# Patient Record
Sex: Female | Born: 1970 | Race: White | Hispanic: No | Marital: Married | Smoking: Former smoker
Health system: Southern US, Community
[De-identification: ages and names within clinical notes are randomized; demographics above are authoritative.]

## PROBLEM LIST (undated history)

## (undated) DIAGNOSIS — T7840XA Allergy, unspecified, initial encounter: Secondary | ICD-10-CM

## (undated) HISTORY — DX: Allergy, unspecified, initial encounter: T78.40XA

---

## 1997-12-29 ENCOUNTER — Emergency Department (HOSPITAL_COMMUNITY): Admission: EM | Admit: 1997-12-29 | Discharge: 1997-12-29 | Payer: Self-pay | Admitting: Emergency Medicine

## 1998-05-14 ENCOUNTER — Other Ambulatory Visit: Admission: RE | Admit: 1998-05-14 | Discharge: 1998-05-14 | Payer: Self-pay | Admitting: Obstetrics and Gynecology

## 1998-08-04 ENCOUNTER — Other Ambulatory Visit: Admission: RE | Admit: 1998-08-04 | Discharge: 1998-08-04 | Payer: Self-pay | Admitting: Obstetrics and Gynecology

## 1998-12-14 ENCOUNTER — Other Ambulatory Visit: Admission: RE | Admit: 1998-12-14 | Discharge: 1998-12-14 | Payer: Self-pay | Admitting: Obstetrics and Gynecology

## 1999-05-31 ENCOUNTER — Other Ambulatory Visit: Admission: RE | Admit: 1999-05-31 | Discharge: 1999-05-31 | Payer: Self-pay | Admitting: Obstetrics and Gynecology

## 2000-07-04 ENCOUNTER — Other Ambulatory Visit: Admission: RE | Admit: 2000-07-04 | Discharge: 2000-07-04 | Payer: Self-pay | Admitting: Obstetrics and Gynecology

## 2000-09-05 ENCOUNTER — Other Ambulatory Visit: Admission: RE | Admit: 2000-09-05 | Discharge: 2000-09-05 | Payer: Self-pay | Admitting: Obstetrics and Gynecology

## 2000-09-05 ENCOUNTER — Encounter (INDEPENDENT_AMBULATORY_CARE_PROVIDER_SITE_OTHER): Payer: Self-pay | Admitting: Specialist

## 2001-01-15 ENCOUNTER — Other Ambulatory Visit: Admission: RE | Admit: 2001-01-15 | Discharge: 2001-01-15 | Payer: Self-pay | Admitting: Obstetrics and Gynecology

## 2001-08-06 ENCOUNTER — Other Ambulatory Visit: Admission: RE | Admit: 2001-08-06 | Discharge: 2001-08-06 | Payer: Self-pay | Admitting: Obstetrics and Gynecology

## 2002-03-26 ENCOUNTER — Other Ambulatory Visit: Admission: RE | Admit: 2002-03-26 | Discharge: 2002-03-26 | Payer: Self-pay | Admitting: Obstetrics and Gynecology

## 2002-07-10 ENCOUNTER — Inpatient Hospital Stay (HOSPITAL_COMMUNITY): Admission: AD | Admit: 2002-07-10 | Discharge: 2002-07-14 | Payer: Self-pay | Admitting: Obstetrics and Gynecology

## 2002-07-12 ENCOUNTER — Encounter: Payer: Self-pay | Admitting: Obstetrics & Gynecology

## 2002-07-14 ENCOUNTER — Encounter: Payer: Self-pay | Admitting: Obstetrics & Gynecology

## 2002-08-10 ENCOUNTER — Inpatient Hospital Stay (HOSPITAL_COMMUNITY): Admission: AD | Admit: 2002-08-10 | Discharge: 2002-08-10 | Payer: Self-pay | Admitting: Obstetrics & Gynecology

## 2002-10-15 ENCOUNTER — Inpatient Hospital Stay (HOSPITAL_COMMUNITY): Admission: AD | Admit: 2002-10-15 | Discharge: 2002-10-19 | Payer: Self-pay | Admitting: Obstetrics and Gynecology

## 2002-10-16 ENCOUNTER — Encounter (INDEPENDENT_AMBULATORY_CARE_PROVIDER_SITE_OTHER): Payer: Self-pay

## 2002-10-20 ENCOUNTER — Encounter: Admission: RE | Admit: 2002-10-20 | Discharge: 2002-11-19 | Payer: Self-pay | Admitting: Obstetrics and Gynecology

## 2002-11-15 ENCOUNTER — Other Ambulatory Visit: Admission: RE | Admit: 2002-11-15 | Discharge: 2002-11-15 | Payer: Self-pay | Admitting: Obstetrics and Gynecology

## 2003-11-17 ENCOUNTER — Other Ambulatory Visit: Admission: RE | Admit: 2003-11-17 | Discharge: 2003-11-17 | Payer: Self-pay | Admitting: *Deleted

## 2003-12-05 ENCOUNTER — Other Ambulatory Visit: Admission: RE | Admit: 2003-12-05 | Discharge: 2003-12-05 | Payer: Self-pay | Admitting: *Deleted

## 2004-01-01 ENCOUNTER — Ambulatory Visit (HOSPITAL_COMMUNITY): Admission: RE | Admit: 2004-01-01 | Discharge: 2004-01-01 | Payer: Self-pay | Admitting: *Deleted

## 2004-10-03 ENCOUNTER — Emergency Department (HOSPITAL_COMMUNITY): Admission: EM | Admit: 2004-10-03 | Discharge: 2004-10-03 | Payer: Self-pay | Admitting: Emergency Medicine

## 2006-02-06 ENCOUNTER — Ambulatory Visit (HOSPITAL_COMMUNITY): Admission: RE | Admit: 2006-02-06 | Discharge: 2006-02-06 | Payer: Self-pay | Admitting: *Deleted

## 2006-04-14 ENCOUNTER — Ambulatory Visit (HOSPITAL_COMMUNITY): Admission: RE | Admit: 2006-04-14 | Discharge: 2006-04-14 | Payer: Self-pay | Admitting: *Deleted

## 2007-02-20 ENCOUNTER — Inpatient Hospital Stay (HOSPITAL_COMMUNITY): Admission: RE | Admit: 2007-02-20 | Discharge: 2007-02-23 | Payer: Self-pay | Admitting: *Deleted

## 2007-02-20 ENCOUNTER — Encounter (INDEPENDENT_AMBULATORY_CARE_PROVIDER_SITE_OTHER): Payer: Self-pay | Admitting: *Deleted

## 2007-10-29 IMAGING — RF DG HYSTEROGRAM
5 series · 5 of 5 positions shown · IV contrast (omnipaque)
Comparison: none

CLINICAL DATA: Secondary infertility. Assess tubal patency.
 HYSTEROSALPINGOGRAM ? 02/06/06:
TECHNIQUE: Following sterile cleansing of the external cervical os with Betadine, hysterosalpingography was performed via placement of a 5 French hysterosalpingography catheter into the endocervical canal where it was stabilized with an end-catheter balloon.  Approximately 12 cc of Omnipaque 300% was injected into the endometrial canal without difficulty.

[Series 1: run · 1 of 1 slices shown (1 of 5)]
[im 1/1]
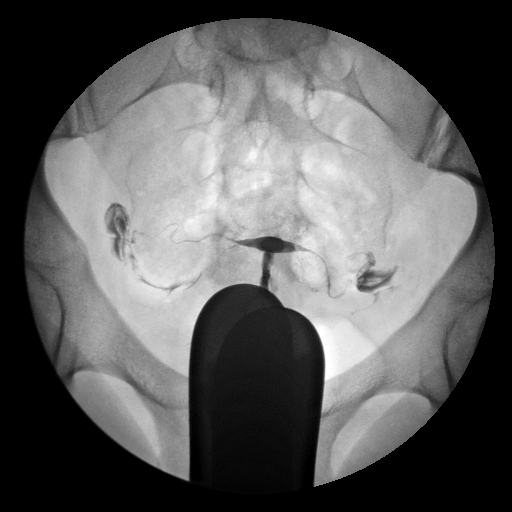

[Series 2: run · 1 of 1 slices shown (2 of 5)]
[im 1/1]
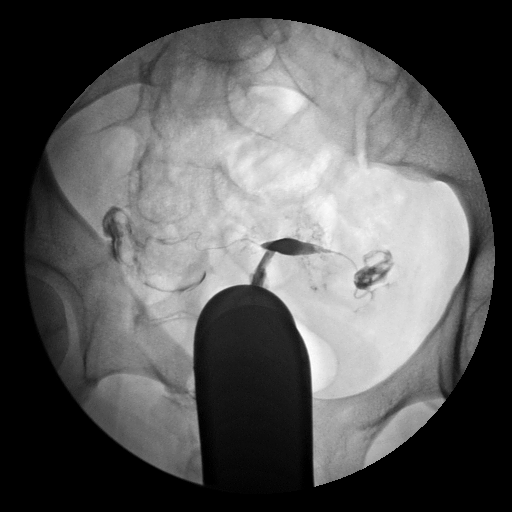

[Series 3: run · 1 of 1 slices shown (3 of 5)]
[im 1/1]
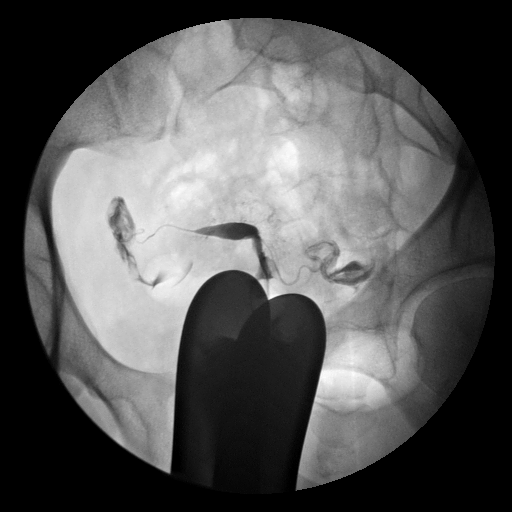

[Series 4: run · 1 of 1 slices shown (4 of 5)]
[im 1/1]
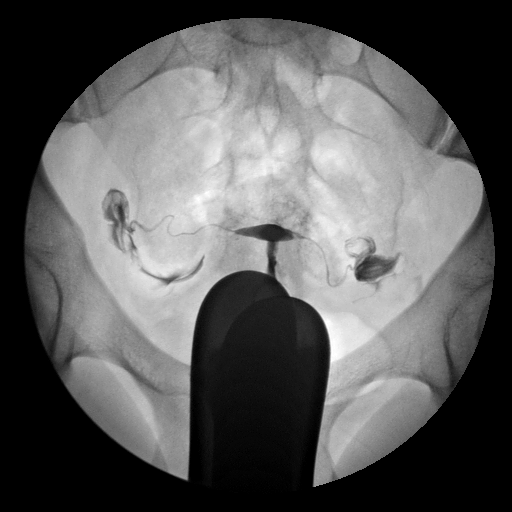

[Series 5: run · 1 of 1 slices shown (5 of 5)]
[im 1/1]
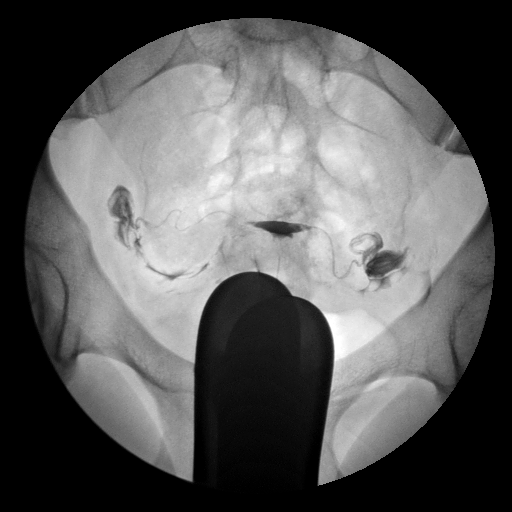

[5 of 5 positions shown; findings below may reference images not displayed]

FINDINGS: Normal endometrial morphology is noted.  Both fallopian tubes demonstrate morphology and bilateral free intraperitoneal spill was documented.  Post-drainage films demonstrate no evidence for loculation of contrast to suggest the presence of peritubal or periovarian adhesions.
IMPRESSION: Normal HSG.

## 2010-08-10 ENCOUNTER — Other Ambulatory Visit: Payer: Self-pay | Admitting: Obstetrics and Gynecology

## 2010-12-07 NOTE — Op Note (Signed)
NAME:  Andrea Case, Andrea Case               ACCOUNT NO.:  [id number]   MEDICAL RECORD NO.:  [id number]          PATIENT TYPE:  INP   LOCATION:  9127                          [facility]AC[facility]  [facility]   PHYSICIAN:  [place] B. Earlene Plater, M.D.  DATE OF BIRTH:  06/28/1971   DATE OF PROCEDURE:  02/20/2007  DATE OF DISCHARGE:                               OPERATIVE REPORT   PREOPERATIVE DIAGNOSES:  1. 38-week intrauterine pregnancy.  2. Previous cesarean section.  3. Oligohydramnios.   POSTOPERATIVE DIAGNOSES:  1. 38-week intrauterine pregnancy.  2. Previous cesarean section.  3. Oligohydramnios.   PROCEDURE:  Repeat low transverse C-section.   SURGEON:  Chester Holstein. Earlene Plater, M.D.   ASSISTANT:  Marlinda Mike, C.N.M.   ANESTHESIA:  Spinal.   SPECIMENS:  Placenta to pathology.   BLOOD LOSS:  500 mL.   COMPLICATIONS:  None.   FINDINGS:  Viable female, vertex presentation; 9 and 9 Apgars.  6 pounds  0 ounces.  Bilobed placenta with velamentous cord insertion.   INDICATION:  The patient was admitted for a repeat C-section for  oligohydramnios at 38 weeks.  She had been followed in the office for  known placenta with velamentous cord insertion.  She had normal serial  ultrasounds showing appropriate girth and normal fluid until last week,  when her fluid was noted to be getting lower.  At that point the AFI was  8.  The patient was instructed to rest at home, hydrate aggressively,  and return in one week for repeat ultrasound.  Yesterday in the office  her AFI was 5; biophysical was 8 out of 8.  Given the trend of worsening  oligohydramnios with known placental complications, I recommended we  proceed with repeat cesarean section.  The patient advised of the risks  of surgery, including infection, bleeding and damage to surrounding  organs.   PROCEDURE:  The patient was taken to the operating room and spinal  anesthesia obtained.  She was prepped in standard fashion.  A Foley  catheter was inserted into the  bladder.   A Pfannenstiel incision made through the previous scar and carried  sharply to the fascia.  The fascia was divided sharply and elevated with  Kocher clamps.  The underlying rectus muscles were dissected away  sharply.   Posterior sheath and peritoneum were elevated and entered sharply.  Bladder blade was inserted.  Bladder flap created sharply.  Uterine  incision made in a low transverse fashion with a knife. Scanty clear  fluid present at amniotomy.  Vertex was elevated to the incision; with  fundal pressure it could not be delivered.  Therefore, the Mityvac  mushroom cup vacuum was placed on the flexion point.  From the mid green  zone, one pull was necessary; no pop offs encountered.  Vertex easily  delivered.  Nose and mouth were suctioned with the bulb.  The remainder  of the infant was delivered without difficulty.  The Cord was clamped  and cut; the infant handed off to the  awaiting pediatricians.  Then 1  gram Ancef was given at cord clamp.   The placenta  was removed by uterine massage.  It was inspected and was  essentially a bilobed placenta with membranes intervening between the  lobes for at least 2-3 cm.  In addition, the cord was velamentous.   The uterus was exteriorized and cleared of all clots and debris.  The  incision was free of extension.  It was closed in a running locked  stitch of 0 chromic.  A second imbricating layer was placed.  A bleeder  at the right angle and in the midline of the incision were each made  hemostatic with a figure-of-eight stitch of the same suture.  Tubes and  ovaries appeared normal.  The uterus was returned to the pelvis.  The  abdomen was irrigated and the uterine incision reinspected; it was  hemostatic, as was the bladder flap and subfascial space.   The fascia was closed in a running stitch of 0 Vicryl.  The subcutaneous  tissue was irrigated and made hemostatic with the Bovie.  The skin was  closed with staples.    The patient tolerated the procedure well and there were no  complications.  She was taken to the recovery room, awake, alert and in  stable condition.  All counts were correct per the operating staff.      Gerri Spore B. Earlene Plater, M.D.  Electronically Signed     WBD/MEDQ  D:  02/20/2007  T:  02/20/2007  Job:  [id number]

## 2010-12-07 NOTE — Discharge Summary (Signed)
NAME:  Andrea Case, Andrea Case               ACCOUNT NO.:  [id number]   MEDICAL RECORD NO.:  [id number]          PATIENT TYPE:  INP   LOCATION:  9127                          [facility]   PHYSICIAN:  [place] B. Earlene Plater, M.D.  DATE OF BIRTH:  Jul 03, 1971   DATE OF ADMISSION:  02/20/2007  DATE OF DISCHARGE:  02/23/2007                               DISCHARGE SUMMARY   ADMISSION DIAGNOSES:  1. A 40-week intrauterine pregnancy.  2. Previous cesarean section.  3. Oligohydramnios.   DISCHARGE DIAGNOSES:  1. A 40-week intrauterine pregnancy.  2. Previous cesarean section.  3. Oligohydramnios.   PROCEDURE:  Repeat low-transverse C-section.   HISTORY OF PRESENT ILLNESS:  A 40 year old white female, gravida 3, para  1, A1, followed in the office for a history of bilobed placenta with  velamentous cord insertion, had appropriate growth and fluid until the  week prior to delivery when her AFI dropped to 8.  Repeat 1 week later  despite rest and hydration the fluid had dropped to an AFI of 5.  Therefore, she was recommended we proceed with delivery.   HOSPITAL COURSE:  The patient was admitted and underwent repeat low-  transverse C-section.  Findings at the time of surgery include 6 pounds  zero ounce female, Apgar's 9 and 9.  Placenta was bilobed with a  velamentous cord insertion.  Postoperatively, the patient rapidly regained her ability to ambulate,  void, and tolerate a regular diet.  She was discharged home on the 3rd  postoperative day in satisfactory condition.   DISCHARGE INSTRUCTIONS:  Per booklet.   DISCHARGE MEDICATIONS:  Percocet 1-2 p.o. q.4-6 h. p.r.n. pain.   [facility] weeks.   DISPOSITION AT DISCHARGE:  Satisfactory.      Gerri Spore B. Earlene Plater, M.D.  Electronically Signed     WBD/MEDQ  D:  02/23/2007  T:  02/23/2007  Job:  [id number]

## 2010-12-10 NOTE — Discharge Summary (Signed)
NAME:  Andrea Case, Andrea Case                         ACCOUNT NO.:  [id number]   MEDICAL RECORD NO.:  [id number]                   PATIENT TYPE:  INP   LOCATION:  9145                                 [facility]   PHYSICIAN:  Maxie Better, M.D.            DATE OF BIRTH:  04/13/1971   DATE OF ADMISSION:  10/15/2002  DATE OF DISCHARGE:  10/19/2002                                 DISCHARGE SUMMARY   ADMISSION DIAGNOSES:  1. Intrauterine gestation at 38 weeks.  2. Pregnancy-induced hypertension.   DISCHARGE DIAGNOSES:  1. Intrauterine gestation at 38+ weeks, delivered.  2. Arrest of descent.  3. Chorioamnionitis.  4. Pregnancy-induced hypertension.  5. Status post primary cesarean section.   HISTORY OF PRESENT ILLNESS:  This is a 40 year old gravida 1, para 0 female  at 38+ weeks gestation, admitted for induction of labor secondary to  pregnancy-induced hypertension.  The patient's prenatal course has been  notable for transient intrauterine growth restriction, oligohydramnios,  which resolved after IV hydration, and a placental mass, which was followed  closely during the pregnancy.   HOSPITAL COURSE:  The patient was admitted for induction secondary to the  above findings.  She had Cervidil for cervical ripening.  Pitocin was  subsequently started on the following day.  Artificial rupture of membranes  was performed.  The patient progressed to full dilatation, pushed for three  hours without any further descent.  The patient developed a temperature of  100.2 around the same time.  The patient was taken to the operating room  where she underwent a primary cesarean section with a resultant delivery of  a live female, 5 pounds 15 ounces, Apgars of 9 and 10.  Placenta was sent to  pathology; subsequently, she had mature, trilobed placenta with slight acute  chorioamnionitis.  Normal tubes and ovaries were noted at the time of the  surgery.  The patient did not necessitate any  magnesium sulfate for her high  blood pressure.  Cord pH was 7.34.   The patient had an unremarkable postoperative course.  CBC on postop day  number one showed a hemoglobin of 10.7, hematocrit of 31.7, and platelet  count of 127,000.  By postop day number three, the patient was afebrile,  tolerating a regular diet.  Incision showed no evidence of infection.  She  was deemed ready to be discharged home.   DISPOSITION:  Home.   CONDITION:  Stable.   DISCHARGE MEDICATIONS:  1. Tylox one to two tablets every three to four hours p.r.n. pain.  2. Motrin 800 mg one p.o. q.6-8h. p.r.n. pain.  3. Prenatal vitamins one p.o. daily.   FOLLOW UP:  Follow up at [facility] in four weeks.    DISCHARGE INSTRUCTIONS:  1. Call for temperature greater than or equal to 100.4.  2. Nothing per vagina for four to six weeks.  3. No heavy lifting and no driving for two weeks.  4. Call for severe abdominal pain, nausea or vomiting, soaking a regular pad     more frequently.                                               Maxie Better, M.D.    [place]/MEDQ  D:  11/26/2002  T:  11/27/2002  Job:  [id number]

## 2010-12-10 NOTE — H&P (Signed)
NAME:  Andrea Case, Andrea Case                           ACCOUNT NO.:  [id number]   MEDICAL RECORD NO.:  [id number]                   PATIENT TYPE:  INP   LOCATION:  9125                                 [facility]   PHYSICIAN:  Genia Del, M.D.             DATE OF BIRTH:  August 01, 1970   DATE OF ADMISSION:  07/10/2002  DATE OF DISCHARGE:                                HISTORY & PHYSICAL   CHIEF COMPLAINT:  Oligohydramnios and severe intrauterine growth  restriction.   HISTORY OF PRESENT ILLNESS:  This is a 40 year old gravida 1, para 0 married  white female with last menstrual period of January 18, 2002 and [facility] of October 27, 2002 who is now at 24-5/[redacted] weeks gestation admitted for aggressive IV  hydration secondary to oligohydramnios.  The patient underwent an ultrasound  on July 10, 2002 to follow up a placental mass that had initially been  seen since her ninth week of pregnancy.  Included in the ultrasound  evaluation was an estimated fetal weight of 619 grams, which was at the  eighth percentile, an amniotic fluid index of 10.0, which was consistent  with oligohydramnios at the sixth percentile, in addition the placental mass  was not identified.  There was a hyperechoic area at the level of the  kidney, which is still present and previously seen on prior ultrasound.  The  hyperechoic area in the bowel region resulting in the patient undergoing an  amniocentesis as well as antiphospholipid antibody panel, which was  negative. The amniocentesis was normal, suggesting a female infant. Cystic  fibrosis evaluation was negative.  The patient does not have a family  history of cystic fibrosis. The patient underwent an ultrasound at [facility] to have a second opinion regarding this placental mass.  They felt this mass was consistent with possible marginal bleeding or  placental infarct.  The ultrasound was done on May 21, 2002 at which  time the estimated fetal  weight was 200 gram, which was at the 51st  percentile.   The patient has no history of hypertension.  She is a nonsmoker.  She has  had a 14-pound weight gain to date.  The patient is now being admitted for  IV fluids and close fetal surveillance.  Her Doppler study was high normal.   PRENATAL FOLLOW UP AND LABORATORY DATA:  Prenatal care was at [facility], obstetrician [facility].  Blood type was A positive, antibody  screen was negative.  RPR was nonreactive.  Rubella was immune.  Hepatitis B  surface antigen was negative.  HIV test was negative.  GC and Chlamydia  cultures were negative.  Pap was within normal limits.  Maternal AFP at  three had been declined. Ultrasound on September 02 for size greater than  dates had shown an intrauterine pregnancy at 9.5 weeks with a hyperechoic  mass measuring 3.0 x 2.2  cm adjacent to the placenta.  Repeat study on  October 01st at 13.4 weeks still showed the mass at about the same size.  This resulted in ultrasound done at [facility] as stated in  the history of present illness.  Anatomic fetal survey was done on November  19th, 19.3 weeks; hyperechoic bowels at the level of the renals.  The  placental mass was 2.8 x 2.1 x 2.7 cm, as stated, amniocentesis was then  done for genetic and cystic fibrosis.   ALLERGIES:  No known drug allergies.   MEDICATIONS:  Prenatal vitamins.   PAST MEDICAL HISTORY:  Negative.   PAST SURGICAL HISTORY:  Negative.   GYNECOLOGICAL HISTORY:  CIN-1 diagnosed in 2002, treated with TCA.   FAMILY HISTORY:  Noncontributory.   SOCIAL HISTORY:  Married.  Nonsmoker.  Works as an Midwife.   REVIEW OF SYSTEMS:  Negative.  The patient has not had any leakage of fluid.  She has noticed fetal movements.  No vaginal bleeding.   PHYSICAL EXAMINATION:  GENERAL APPEARANCE:  Gravid white female in no acute  distress.  VITAL SIGNS:  Blood pressure 119/59.  Fetal heart rate 140.   Afebrile.  SKIN:  Shows no lesions.  HEENT EXAMINATION:  Anicteric sclerae.  Pink conjunctivae.  Oropharynx  negative.  HEART:  Regular rate and rhythm without murmur.  LUNGS:  Clear to auscultation.  BREASTS:  Soft and nontender.  No palpable mass.  ABDOMEN:  Gravid.  EXTREMITIES:  No edema.  PELVIC:  Deferred.   IMPRESSION:  1. Oligohydramnios.  2. Intrauterine __________  at 24-5/7 weeks.  3. Intrauterine growth restriction of unclear etiology.   PLAN:  1. Admission.  2. Aggression IV hydration.  3. Repeat amniotic fluid index in 48 hours.  4. Initiation of consultation.  5. One-hour GCT.  6. Serial growth every 2-3weeks.  7. Continuous fetal monitoring.  8. Betamethasone at around 28 weeks and group B Strep at that time as well.  9. High protein diet.       Maxie Better, M.D.                  Genia Del, M.D.    [place]/MEDQ  D:  07/10/2002  T:  07/10/2002  Job:  [id number]

## 2010-12-10 NOTE — H&P (Signed)
NAME:  Andrea Case, Andrea Case                         ACCOUNT NO.:  [id number]   MEDICAL RECORD NO.:  [id number]                   PATIENT TYPE:  INP   LOCATION:  9163                                 [facility]   PHYSICIAN:  Maxie Better, M.D.            DATE OF BIRTH:  17-May-1971   DATE OF ADMISSION:  10/15/2002  DATE OF DISCHARGE:                                HISTORY & PHYSICAL   CHIEF COMPLAINT:  Induction of labor secondary to elevated blood pressure.   HISTORY OF PRESENT ILLNESS:  The patient is a 40 year old gravida 1 para 0  married white female, last menstrual period of 01/18/2002, EDC of  10/27/2002, who is now at 38-4/[redacted] weeks gestation being admitted for  induction of labor secondary to elevated blood pressure.  The patient was  seen in our obstetrical visit on 10/15/2002 at which time her blood pressure  was noted to be 134/92, repeat blood pressure was 130/90.  She had no  proteinuria.  Her exam was notable for trace edema.  The patient denied any  headache, visual changes, or change in her heartburn.  She has noticed some  leg edema, slight hand edema, no facial edema.  The patient has had no  proteinuria and she had not had any weight gain.  STAT PIH labs were  performed.  The patient has no history of elevated blood pressures.  Her  first trimester blood pressure was 112/60.  Her prenatal course has been  complicated by the finding in the first trimester of a hyperechoic mass  adjacent to the placenta for which she was followed throughout the pregnancy  and which on ultrasound today, 10/15/2002, was question of anterior ________  lobe versus a placental mass, a grade 3 placenta.  The patient was seen at  [facility] regarding this mass and was thought to have  probably a placental infarct.  At the 20 weeks anatomic fetal survey there  was suggestion of a hyperechoic bowels which led to the amniocentesis being  performed which ultimately revealed  normal chromosomal XX and normal  amniotic fluid alpha fetoprotein.  Antiphospholipid antibody panel was done  at that time which was also negative.  The baby had a suggestion of  intrauterine growth restriction at that time.  The patient was placed at bed  rest and was followed closely.  She had admission to [facility] from  12/17-19/2003 for oligohydramnios which responded to aggressive IV  hydration.  The maternal poor weight gain was augmented by increasing the  protein in her diet and nutritional consultation.  The patient had close  fetal surveillance due to the intrauterine growth restriction which  subsequently resolved.  Group B strep culture was negative.  The last  ultrasound showed an estimated fetal weight of 3211 g which was at the 37th  percentile, normal amniotic fluid index was 11.2, placenta with a possible  anterior ________ lobe versus  a placenta mass that had been previously seen.  The patient's PIH labs were normal with the exception of increased uric acid  to 7.9.  Prenatal care is at [facility].  Primary obstetrician,  Sheronette Cousins.   PRENATAL LABORATORIES:  Blood type is A positive, antibody screen is  negative, RPR is nonreactive, rubella is immune, hepatitis B surface antigen  is negative, HIV test was negative, GC and Chlamydia cultures were negative,  Pap smear was normal, AFP3 serum was declined, amniocentesis done 46XX,  normal amniotic fluid alpha fetoprotein, 1-hour GCT was normal, group B  strep culture was negative.   ALLERGIES:  No known drug allergies.   MEDICINES:  Prenatal vitamins.   MEDICAL HISTORY:  Recurrent urinary tract infections in the past.   SURGERIES:  None.   GYNECOLOGIC HISTORY:  Abnormal Pap smear treated with TCA in the past,  positive HPV.   FAMILY HISTORY:  Mother died of an MI at age 37.  Maternal grandmother died  with a blood clot after heart surgery.   SOCIAL HISTORY:  Married, nonsmoker, Production designer, theatre/television/film.   REVIEW OF SYSTEMS:  As per HPI.   PHYSICAL EXAMINATION:  GENERAL:  Gravid, well-developed, well-nourished  white female in no acute distress.  VITAL SIGNS:  Blood pressure 134/92, weight 184 pounds, fetal heart rate  139.  SKIN:  No lesions.  HEENT:  Anicteric sclerae.  Pink conjunctivae.  Oropharynx negative.  HEART:  Regular rate and rhythm without murmur.  LUNGS:  Clear to auscultation.  BREASTS:  Soft, nontender.  No palpable mass.  ABDOMEN:  Gravid, fundal height of 37 cm.  PELVIC:  Exam 1 cm dilated, about 2 cm Peto, -2, vertex posterior.  EXTREMITIES:  Trace edema.  Deep tendon reflexes 2+.  No clonus.   IMPRESSION:  1. Pregnancy-induced hypertension.  2. Abnormal presentation.  3. Intrauterine gestation at 38-4/7 weeks.  4. Group B streptococci culture negative.   PLAN:  Admission.  Routine admission labs.  Cervidil for cervical ripening.  Low-dose Pitocin on 10/16/2002.  Magnesium sulfate p.r.n.  Repeat PIH p.r.n.  Analgesics p.r.n.                                               Maxie Better, M.D.    [place]/MEDQ  D:  10/16/2002  T:  10/16/2002  Job:  [id number]

## 2010-12-10 NOTE — Op Note (Signed)
NAME:  Andrea Case, Andrea Case               ACCOUNT NO.:  [id number]   MEDICAL RECORD NO.:  [id number]          PATIENT TYPE:  AMB   LOCATION:  SDC                           [facility]   PHYSICIAN:  [place] B. Earlene Plater, M.D.  DATE OF BIRTH:  Nov 20, 1970   DATE OF PROCEDURE:  04/14/2006  DATE OF DISCHARGE:  04/14/2006                                 OPERATIVE REPORT   PREOPERATIVE DIAGNOSIS:  Severe cervical dysplasia.   POSTOPERATIVE DIAGNOSIS:  Severe cervical dysplasia.   PROCEDURE:  CO2 laser vaporization of cervical dysplasia.   SURGEON:  Chester Holstein. Earlene Plater, M.D.   ANESTHESIA:  MAC and 10 mL of 1% lidocaine with epinephrine paracervical  block.   SPECIMENS:  None.   COMPLICATIONS:  None.   INDICATIONS:  The patient with colposcopically proven persistent severe  dysplasia, previously treated with laser in the past. However, the patient  is actively trying to conceive and wants to minimize damage to the cervix in  terms of future preterm birth risk. Therefore, repeat laser recommended.  Risks of surgery discussed including infection, bleeding damage to  surrounding organs and potentially increased risk of preterm birth.   PROCEDURE:  The patient was taken to the operating room and MAC anesthesia  obtained. Speculum inserted into the vagina. Perineum and thighs were  covered with moistened towels. Acetic acid placed on the cervix, and  acetowhite changes were noted to be very faint around the transition zone.  Paracervical block placed and the transition zone ablated with laser set on  10 watts continuous. Hemostasis obtained with laser and Monsel's solution.   The patient tolerated the procedure well with no complications. She was  taken to the recovery room in stable condition.      Gerri Spore B. Earlene Plater, M.D.  Electronically Signed     WBD/MEDQ  D:  04/14/2006  T:  04/18/2006  Job:  [id number]

## 2010-12-10 NOTE — Discharge Summary (Signed)
   NAME:  Andrea Case, Andrea Case                           ACCOUNT NO.:  [id number]   MEDICAL RECORD NO.:  [id number]                   PATIENT TYPE:  INP   LOCATION:  9125                                 [facility]   PHYSICIAN:  Genia Del, M.D.             DATE OF BIRTH:  11-Apr-1971   DATE OF ADMISSION:  07/10/2002  DATE OF DISCHARGE:  07/14/2002                                 DISCHARGE SUMMARY   ADMISSION DIAGNOSIS:  Gestation at 24 weeks 5 days with oligohydramnios and  severe intrauterine growth restriction.   DISCHARGE DIAGNOSES:  Gestation at 24 weeks 5 days with resolved  oligohydramnios and severe intrauterine growth restriction.   HOSPITAL COURSE:  The patient was admitted at 25 weeks and 5 days gestation  with oligohydramnios with an amniotic fluid index at 10 cm for the sixth  percentile.  The estimated fetal weight was 619 g, which was the eighth  percentile.  During hospitalization, the patient was put on bed rest with  high IV fluids and high protein diet.  A repeat ultrasound was done on  July 12, 2002, showing amniotic fluid index at 8.6 cm for less than the  fifth percentile.  Then a repeat ultrasound was done again on July 14, 2002.  At that time, the amniotic fluid was within normal limits at 13.5 cm.  There was a reassuring fetal heart rate monitoring.  The decision was made  to discharge the patient home with bed rest, fetal monitor counts weekly and  follow up the same week with biophysical profile at the office.  The patient  was in stable status at the time of discharge.  Precautions for intrauterine  growth restrictions and oligohydramnios were given.                                               Genia Del, M.D.    [place]/MEDQ  D:  08/21/2002  T:  08/21/2002  Job:  [id number]

## 2010-12-10 NOTE — Op Note (Signed)
NAME:  Andrea Case, Andrea Case                         ACCOUNT NO.:  [id number]   MEDICAL RECORD NO.:  [id number]                   PATIENT TYPE:  AMB   LOCATION:  SDC                                  FACILITY:  [facility]   PHYSICIAN:  [place] B. Earlene Plater, M.D.               DATE OF BIRTH:  March 28, 1971   DATE OF PROCEDURE:  01/01/2004  DATE OF DISCHARGE:                                 OPERATIVE REPORT   PREOPERATIVE DIAGNOSES:  Severe cervical dysplasia.   POSTOPERATIVE DIAGNOSES:  Severe cervical dysplasia.   PROCEDURE:  CO2 laser vaporization of cervical dysplasia.   SURGEON:  Chester Holstein. Earlene Plater, M.D.   ANESTHESIA:  MAC and 10 mL 1% lidocaine with epinephrine as paracervical  block.   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   ESTIMATED BLOOD LOSS:  __________   COMPLICATIONS:  None.   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  The area of  abnormality was noted to be very small and to minimize trauma to the cervix,  I recommended laser as opposed to LEEP and recommended laser as opposed to  cryotherapy for patient comfort and to also minimize cervical trauma.   DESCRIPTION OF PROCEDURE:  The patient was taken to the operating room and  MAC anesthesia obtained. She was placed in the [place] stirrups and draped  with wet towels in standard fashion.  A speculum was inserted and the cervix  prepped with acetic acid.  Colposcopy was performed and the above findings  noted.  Paracervical block placed and the area of abnormality was noted to  be approximately 3-4 mm in width and depth arising from the 12 o'clock  position. The area was outlined with the laser and the abnormality ablated  to a depth of approximately 3 mm with the CO2 laser set on 10 watts  continuous.  Hemostasis obtained.  Monsel solution applied.   The patient  tolerated the procedure well and there were no complications.  She was taken to the recovery room, awake, alert and in stable condition.                                               Gerri Spore B. Earlene Plater, M.D.    WBD/MEDQ  D:  01/01/2004  T:  01/01/2004  Job:  [id number]

## 2010-12-10 NOTE — Op Note (Signed)
NAME:  Andrea Case, Andrea Case                         ACCOUNT NO.:  [id number]   MEDICAL RECORD NO.:  [id number]                   PATIENT TYPE:  INP   LOCATION:  9145                                 FACILITY:  [facility]   PHYSICIAN:  Maxie Better, M.D.            DATE OF BIRTH:  1971/07/07   DATE OF PROCEDURE:  10/16/2002  DATE OF DISCHARGE:                                 OPERATIVE REPORT   PREOPERATIVE DIAGNOSES:  1. Arrest of descent.  2. Chorioamnionitis.  3. Pregnancy induced hypertension.   PROCEDURE:  Primary cesarean section.   POSTOPERATIVE DIAGNOSES:  1. Arrest of descent.  2. Chorioamnionitis.  3. Pregnancy induced hypertension.   ANESTHESIA:  Epidural.   SURGEON:  Maxie Better, M.D.   INDICATIONS:  This is a 40 year old gravida 1, para 0 female at 38+ weeks  gestation admitted on October 15, 2002 for induction of labor secondary to  pregnancy induced hypertension.  The patient's prenatal care had been  complicated by a transient intrauterine growth restriction and a normal  placental anatomy and oligohydramnios that had resolved.  She was admitted  to [facility] on March 23.  Cervidil was placed for cervical ripening.  Pitocin was started on March 24.  Artificial rupture of membranes was  accomplished.  Pitocin was done.  The patient subsequently progressed to  full dilatation, pushed for three hours.  Examination revealed a fully  dilated cervix +1 station with a right occiput transverse presentation.  During the time of pushing the patient developed a temperature of 100.2  axillary.  Decision was made to proceed with a primary cesarean section  based on the fetal position and prolonged pushing.  Risks and benefits of  procedure have been explained to the patient.  Consent was signed.  The  patient was transferred to the operating room.   PROCEDURE:  Under adequate epidural anesthesia the patient was placed in the  supine position with left lateral tilt.   An indwelling Foley catheter was  already in place.  The patient was sterilely prepped and draped in the usual  fashion.  Pfannenstiel skin incision was made after 0.25% Marcaine was  injected along the planned incision line.  The incision was carried down to  the rectus fascia using Bovie cautery.  The rectus fascia was incised in  midline, extended bilaterally.  The rectus fascia was then bluntly with  cautery dissected off the rectus muscle in superior and inferior fashion.  The rectus muscle split in the midline.  The parietoperitoneum was entered  bluntly.  The vesicouterine peritoneum was then created.  Bladder was then  bluntly dissected off the lower uterine segment and displaced inferiorly  using a bladder retractor.  A curvilinear low transverse uterine incision  was then made, extended transversely using bandage scissors.  Loop of cord  was noted around the body of the baby.  Initial attempt at delivery of the  vertex was unsuccessful  as the head was wedged in the pelvis as a right  occiput transverse position.  The baby was dislodged through the vagina and  subsequently delivered.  Cord was clamped, cut.  The baby was bulb suctioned  on the abdomen.  It was transferred to the awaiting pediatricians who  assigned Apgars of 9 and 10 at one and five minutes.  Cord pH was obtained  which was 7.35.  Placenta was spontaneous, intact with evidence of accessory  lobe.  Uterine cavity was cleaned of debris.  No intracavitary lesion was  noted.  There was a left inferior extension of the incision of about 2  inches.  The extension was closed with 0 Monocryl running locked stitch.  The remaining incision was then closed in the transverse fashion and a 0  Monocryl running locked stitch which was imbricating  using 0 Monocryl.  Small areas of bleeding on the incision were hemostased with a figure-of-  eight 0 Monocryl suture.  Small bleeding along the peritoneal edge was  cauterized.  The  abdomen was copiously irrigated, suctioned of debris.  Normal tubes and ovaries were noted bilaterally.  The vesicouterine  peritoneum and parietoperitoneum were now closed.  The rectus fascia was  closed with 0 Vicryl x2 after the under surface of the rectus fascia and  rectus muscles inspected for bleeders.  The subcutaneous area was irrigated,  suctioned of debris.  The skin was approximated using Ethicon staples.   SPECIMENS:  Placenta sent to pathology.   ESTIMATED BLOOD LOSS:  800 mL.   INTRAOPERATIVE FLUIDS:  2 L.   URINE OUTPUT:  75 mL.   COUNTS:  Sponge and instrument counts x2 were correct.   COMPLICATIONS:  None.   DISPOSITION:  The patient tolerated the procedure well.  Was transferred to  recovery room in stable condition.  Weight of the baby was 5 pounds 15  ounces.                                               Maxie Better, M.D.    [place]/MEDQ  D:  10/17/2002  T:  10/17/2002  Job:  [id number]

## 2010-12-10 NOTE — H&P (Signed)
NAME:  Andrea Case, Andrea Case                         ACCOUNT NO.:  [id number]   MEDICAL RECORD NO.:  [id number]                   PATIENT TYPE:  AMB   LOCATION:  SDC                                  [facility]ILITY:  [facility]   PHYSICIAN:  [place] B. Earlene Plater, M.D.               DATE OF BIRTH:  05-26-1971   DATE OF ADMISSION:  DATE OF DISCHARGE:                                HISTORY & PHYSICAL   DATE OF PROCEDURE:  January 01, 2004   PREOPERATIVE DIAGNOSIS:  Cervical intraepithelial neoplasia grade 2.   INTENDED PROCEDURE:  Laser vaporization of the cervix.   HISTORY OF PRESENT ILLNESS:  A 40 year old white female gravida 1 para 1  presents for a laser vaporization of cervical dysplasia after a colposcopy-  proven CIN-2.  She is interested in additional childbearing and wishes to  minimize cervical trauma.  I therefore recommended laser as opposed to LEEP  or cold knife conization.   PAST MEDICAL HISTORY:  1. UTI.  2. Pneumonia.   SURGICAL HISTORY:  C-section.   MEDICATIONS:  Ortho-Novum 7/7/7.   ALLERGIES:  None.   SOCIAL HISTORY:  Occasional alcohol.  No tobacco or other drugs.   FAMILY HISTORY:  Heart disease and diabetes.   REVIEW OF SYSTEMS:  Otherwise negative.   PHYSICAL EXAMINATION:  VITAL SIGNS:  Blood pressure 102/80, pulse 88, weight  127.  GENERAL:  Alert and oriented, no acute distress.  SKIN:  Warm and dry, no lesions.  HEART:  Regular rate and rhythm.  LUNGS:  Clear to auscultation.  ABDOMEN:  Liver and spleen normal, no hernia.  PELVIC:  Normal external genitalia, vagina and cervix normal.  On colposcopy  there is a 5 mm area of dense acetowhite changes noted at the 12 o'clock  position.  From this area biopsy has shown CIN-2 and an ECC was positive for  high-grade CIN-2; however, in my opinion this was likely dragged down after  the biopsy.   ASSESSMENT:  Small area of cervical intraepithelial neoplasia grade 2 of the  cervix.   PLAN:  Laser vaporization with CO2 laser.   Operative risks discussed  including infection, bleeding, cervical trauma with potential for preterm  cervical changes in subsequent pregnancy; however, reduced by using laser  over LEEP or conization.  We also discussed cryotherapy as an alternative  but the patient preferred laser treatment.  All questions answered.  The  patient wishes to proceed.                                               Gerri Spore B. Earlene Plater, M.D.    WBD/MEDQ  D:  12/26/2003  T:  12/26/2003  Job:  [id number]

## 2011-05-09 LAB — CBC
HCT: 26.3 — ABNORMAL LOW
Hemoglobin: 10.8 — ABNORMAL LOW
MCV: 88.6
Platelets: 238
RBC: 2.96 — ABNORMAL LOW
RBC: 2.97 — ABNORMAL LOW
RDW: 13.6
WBC: 8.9
WBC: 9.4

## 2011-05-09 LAB — URIC ACID: Uric Acid, Serum: 8 — ABNORMAL HIGH

## 2011-05-09 LAB — COMPREHENSIVE METABOLIC PANEL
AST: 25
BUN: 6
CO2: 27
Chloride: 104
Creatinine, Ser: 0.87
GFR calc non Af Amer: >60
Total Bilirubin: 0.7

## 2011-07-15 ENCOUNTER — Ambulatory Visit (INDEPENDENT_AMBULATORY_CARE_PROVIDER_SITE_OTHER): Payer: 59

## 2011-07-15 DIAGNOSIS — H6980 Other specified disorders of Eustachian tube, unspecified ear: Secondary | ICD-10-CM

## 2011-07-15 DIAGNOSIS — J019 Acute sinusitis, unspecified: Secondary | ICD-10-CM

## 2011-07-15 DIAGNOSIS — Z23 Encounter for immunization: Secondary | ICD-10-CM

## 2012-01-10 ENCOUNTER — Other Ambulatory Visit: Payer: Self-pay | Admitting: Dermatology

## 2012-06-14 ENCOUNTER — Ambulatory Visit (INDEPENDENT_AMBULATORY_CARE_PROVIDER_SITE_OTHER): Payer: 59 | Admitting: Physician Assistant

## 2012-06-14 VITALS — BP 110/72 | HR 76 | Temp 98.6°F | Resp 16 | Ht 65.0 in | Wt 132.0 lb

## 2012-06-14 DIAGNOSIS — R05 Cough: Secondary | ICD-10-CM

## 2012-06-14 DIAGNOSIS — R059 Cough, unspecified: Secondary | ICD-10-CM

## 2012-06-14 DIAGNOSIS — J40 Bronchitis, not specified as acute or chronic: Secondary | ICD-10-CM

## 2012-06-14 DIAGNOSIS — R0981 Nasal congestion: Secondary | ICD-10-CM

## 2012-06-14 DIAGNOSIS — H9190 Unspecified hearing loss, unspecified ear: Secondary | ICD-10-CM

## 2012-06-14 DIAGNOSIS — H9319 Tinnitus, unspecified ear: Secondary | ICD-10-CM

## 2012-06-14 DIAGNOSIS — J3489 Other specified disorders of nose and nasal sinuses: Secondary | ICD-10-CM

## 2012-06-14 MED ORDER — HYDROCODONE-HOMATROPINE 5-1.5 MG/5ML PO SYRP: ORAL_SOLUTION | ORAL | Status: DC

## 2012-06-14 MED ORDER — IPRATROPIUM BROMIDE 0.06 % NA SOLN: 2.0000 | Freq: Three times a day (TID) | NASAL | Status: DC

## 2012-06-14 MED ORDER — AZITHROMYCIN 250 MG PO TABS: ORAL_TABLET | ORAL | Status: DC

## 2012-06-14 NOTE — Progress Notes (Signed)
Patient ID: LAJOYCE TAMURA MRN: [id number], DOB: [birth year], 41 y.o. Date of Encounter: 06/14/2012, 11:54 AM  Primary Physician: No primary provider on file.  Chief Complaint:  Chief Complaint  Patient presents with  . URI    x 3 weeks  . Cough    HPI: 41 y.o. year old female presents with a three week history of nasal congestion, post nasal drip, and cough. Mild sinus pressure. Afebrile. No chills. Nasal congestion thick and green/yellow. Cough is productive of yellow sputum and not associated with time of day. Ears are a little sore. No wheezing, shortness of breath, dyspnea, palpitations, or edema. Has tried OTC cold preps without success. No GI complaints. Appetite slightly decreased. Her daughter has also been sick with similar symptoms, and also had two ear infections recently requiring Amoxicillin and Augmentin which she is currently taking. No recent antibiotics, or recent travels. No leg trauma, sedentary periods, history of cancer, or current tobacco use.   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 States this has been going on for more than 15 years. 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 Notes that she is frequently having to ask folks to repeat themselves.   Past Medical History  Diagnosis Date  . Allergy      Home Meds: Prior to Admission medications   Medication Sig Start Date End Date Taking? Authorizing Provider  drospirenone-ethinyl estradiol (YAZ,GIANVI,LORYNA) 3-0.02 MG tablet Take 1 tablet by mouth daily.   Yes Historical Provider, MD                         Allergies: No Known Allergies  History   Social History  . Marital Status: Married    Spouse Name: N/A    Number of Children: N/A  . Years of Education: N/A   Occupational History  . Not on file.   Social History Main Topics  . Smoking status: Former Games developer  . Smokeless  tobacco: Never Used  . Alcohol Use: Yes  . Drug Use: No  . Sexually Active: Yes    Birth Control/ Protection: Pill   Other Topics Concern  . Not on file   Social History Narrative  . No narrative on file     Review of Systems: Constitutional: negative for chills, fever, night sweats or weight changes HEENT: see above Cardiovascular: negative for chest pain or palpitations Respiratory: negative for hemoptysis, wheezing, or shortness of breath Abdominal: negative for abdominal pain, nausea, vomiting or diarrhea Dermatological: negative for rash Neurologic: negative for headache   Physical Exam: Blood pressure 110/72, pulse 76, temperature 98.6 F (37 C), temperature source Oral, resp. rate 16, height 5\' 5"  (1.651 m), weight 132 lb (59.875 kg), last menstrual period 06/04/2012., Body mass index is 21.97 kg/(m^2). General: Well developed, well nourished, in no acute distress. Head: Normocephalic, atraumatic, eyes without discharge, sclera non-icteric, nares are congested. Bilateral auditory canals clear, TM's are without perforation, pearly grey with reflective cone of light bilaterally. No effusion. No sinus TTP. Oral cavity moist, dentition normal. Posterior pharynx with post nasal drip and mild erythema. No peritonsillar abscess or tonsillar exudate.  Neck: Supple. No thyromegaly. Full ROM. No lymphadenopathy. Lungs: Coarse breath sounds bilaterally without wheezes, rales, or rhonchi. Breathing is unlabored.  Heart: RRR with S1 S2. No murmurs, rubs, or gallops appreciated. Msk:  Strength and tone normal for age. Extremities:  No clubbing or cyanosis. No edema. Neuro: Alert and oriented X 3. Moves all extremities spontaneously. CNII-XII grossly in tact. Psych:  Responds to questions appropriately with a normal affect.     ASSESSMENT AND PLAN:  41 y.o. year old female with bronchitis, cough, and [place] term hearing loss/tinnitus  1. Bronchitis/cough -Azithromycin 250 MG #6 2 po  first day then 1 po next 4 days no RF -Hycodan #4oz 1 tsp po q 4-6 hours prn cough no RF SED -Atrovent NS 0.06% 2 sprays each nare bid prn #1 no RF -Mucinex -Tylenol/Motrin prn -Rest/fluids -RTC precautions -RTC 3-5 days if no improvement  2. 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

## 2012-06-30 ENCOUNTER — Ambulatory Visit (INDEPENDENT_AMBULATORY_CARE_PROVIDER_SITE_OTHER): Payer: 59 | Admitting: Family Medicine

## 2012-06-30 VITALS — BP 118/62 | HR 80 | Temp 98.0°F | Resp 16 | Ht 65.0 in | Wt 131.0 lb

## 2012-06-30 DIAGNOSIS — Z2089 Contact with and (suspected) exposure to other communicable diseases: Secondary | ICD-10-CM

## 2012-06-30 DIAGNOSIS — J40 Bronchitis, not specified as acute or chronic: Secondary | ICD-10-CM

## 2012-06-30 DIAGNOSIS — Z20818 Contact with and (suspected) exposure to other bacterial communicable diseases: Secondary | ICD-10-CM

## 2012-06-30 MED ORDER — HYDROCODONE-HOMATROPINE 5-1.5 MG/5ML PO SYRP: 5.0000 mL | ORAL_SOLUTION | Freq: Three times a day (TID) | ORAL | Status: DC | PRN

## 2012-06-30 MED ORDER — ALBUTEROL SULFATE HFA 108 (90 BASE) MCG/ACT IN AERS: 2.0000 | INHALATION_SPRAY | Freq: Four times a day (QID) | RESPIRATORY_TRACT | Status: DC | PRN

## 2012-06-30 MED ORDER — FLUCONAZOLE 150 MG PO TABS: 150.0000 mg | ORAL_TABLET | Freq: Once | ORAL | Status: DC

## 2012-06-30 MED ORDER — LEVOFLOXACIN 500 MG PO TABS: 500.0000 mg | ORAL_TABLET | Freq: Every day | ORAL | Status: DC

## 2012-06-30 NOTE — Patient Instructions (Signed)
Using Your Inhaler  Proper inhaler technique is very important. Good technique assures that the medicine reaches the lungs. Poor technique results in depositing the medicine on the tongue and back of the throat rather than in the airways.  STEPS TO FOLLOW IF USING INHALER WITHOUT EXTENSION TUBE:  1. Remove cap from inhaler.  2. Shake inhaler for 5 seconds before each inhalation (breathing in).  3. Position the inhaler so that the top of the canister faces up.  4. Put your index finger on the top of the medication canister. Your thumb supports the bottom of the inhaler.  5. Open your mouth.  6. Hold the inhaler 1 to 2 inches away from your open mouth. This allows the medicine to slow down before the medicine enters the mouth.  7. Exhale (breathe out) normally and as completely as possible.  8. Press the canister down with the index finger to release the medication.  9. At the same time as the canister is pressed, inhale deeply and slowly until the lungs are completely filled. This should take 4 to 6 seconds. Keep your tongue down and out of the way.  10. Hold the medication in your lungs for up to 10 seconds (10 seconds is best). This helps the medicine get into the small airways of your lungs to work better.  11. Breathe out slowly, through pursed lips. Whistling is an example of pursed lips.  12. Wait at least 1 minute between puffs. Continue with the above steps until you have taken the number of puffs your caregiver has ordered.  13. Replace cap on inhaler.  STEPS TO FOLLOW USING AN INHALER WITH AN EXTENSION (SPACER):  1.  Remove cap from inhaler.  2. Shake inhaler for 5 seconds before each inhalation (breathing in).  3. Your caregiver has asked you to use a spacer with your inhaler. A spacer is a plastic tube with a mouthpiece on one end and an opening that connects to the inhaler on the other end. A spacer helps you take the medicine better.   4. Place the open end of the spacer onto the mouthpiece of the inhaler.  5. Position the inhaler so that the top of the canister faces up and the spacer mouthpiece faces you.  6. Put your index finger on the top of the medication canister. Your thumb supports the bottom of the inhaler and the spacer.  7. Exhale (breathe out) normally and as completely as possible.  8. Immediately after exhaling, place the spacer between your teeth and into your mouth. Close your mouth tightly around the spacer.  9. Press the canister down with the index finger to release the medication.  10. At the same time as the canister is pressed, inhale deeply and slowly until the lungs are completely filled. This should take 4 to 6 seconds. Keep your tongue down and out of the way.  11. Hold the medication in your lungs for up to 10 seconds (10 seconds is best). This helps the medicine get into the small airways of your lungs to work better. Exhale.  12. Repeat inhaling deeply through the spacer mouthpiece. Again hold that breath for up to 10 seconds (10 seconds is best). Exhale slowly. If it is difficult to take this second deep breath through the spacer, breathe normally several times through the spacer. Remove the spacer from your mouth.  13. Wait at least 1 minute between puffs. Continue with the above steps until you have taken the number of puffs   your caregiver has ordered.  14. Remove spacer from the inhaler and place cap on inhaler.  If you are using different kinds of inhalers, use your quick relief medicine to open the airways 10 - 15 minutes before using a steroid. If you are unsure which inhalers to use and the order of using them, ask your caregiver, nurse, or respiratory therapist.  If you are using a steroid inhaler, rinse your mouth with water after your last puff and then spit out the water. Do not swallow the water.  Avoid the following:    Inhaling before or after starting the spray of medicine. It takes practice to coordinate your breathing with triggering the spray.   Inhaling through the nose (rather than the mouth) when triggering the spray.  HOW TO DETERMINE IF YOUR INHALER IS FULL OR NEARLY EMPTY:   Determine when an inhaler is empty. You cannot know when an inhaler is empty by shaking it. A few inhalers are now being made with dose counters. Ask your caregiver for a prescription that has a dose counter if you feel you need that extra help.   If your inhaler does not have a counter, check the number of doses in the inhaler before you use it. The canister or box will list the number of doses in the canister. Divide the total number of doses in the canister by the number you will use each day to find how many days the canister will last. (For example, if your canister has 200 doses and you take 2 puffs, 4 times each day, which is 8 puffs a day. Dividing 200 by 8 equals 25. The canister should last 25 days.) Using a calendar, count forward that many days to see when your inhaler will run out. Write the refill date on a calendar or your canister.   Remember, if you need to take extra doses, the inhaler will empty sooner than you figured. Be sure you have a refill before your canister runs out. Refill your inhaler 7 to 10 days before it runs out.  HOME CARE INSTRUCTIONS     Do not use the inhaler more than your caregiver tells you. If you are still wheezing and are feeling tightness in your chest, call your caregiver.   Keep an adequate supply of medication. This includes making sure the medicine is not expired, and you have a spare inhaler.   Follow your caregiver or inhaler insert directions for cleaning the inhaler and spacer.  SEEK MEDICAL CARE IF:     Symptoms are only partially relieved with your inhaler.   You are having trouble using your inhaler.   You experience some increase in phlegm.    You develop a fever of 100.5 F (38.1 C).  SEEK IMMEDIATE MEDICAL CARE IF:     You feel little or no relief with your inhalers. You are still wheezing and are feeling shortness of breath and/or tightness in your chest.   You have side effects such as dizziness, headaches or fast heart rate.   You have chills, fever, night sweats or an oral temperature above 102 F (38.9 C).   Phlegm production increases a lot, or there is blood in the phlegm.  MAKE SURE YOU:     Understand these instructions.   Will watch your condition.   Will get help right away if you are not doing well or get worse.  Document Released: 07/08/2000 Document Revised: 10/03/2011 Document Reviewed: 04/28/2009  Exit[employer] Patient Information 2013 [employer].

## 2012-06-30 NOTE — Progress Notes (Signed)
41 yo woman with 6 weeks of cough, productive, yellow phlegm.  She was given a Z pak over 10 days ago.   No SOB  Daughter (5) recently dx'd with strep.  Objective:  NAD  HEENT: Unremarkable except for mild erythema in the posterior pharynx Lungs: Clear to auscultation Heart: Regular no murmur Extremities: No edema Skin: Fine papular nonspecific rash in the lower lumbar area bilaterally which is mild and appears to be fading  Assessment: Persistent bronchitis  Plan: 1. Bronchitis, not specified as acute or chronic    2. Strep throat exposure  Culture, Group A Strep

## 2012-07-02 LAB — CULTURE, GROUP A STREP: Organism ID, Bacteria: NORMAL

## 2012-10-15 ENCOUNTER — Other Ambulatory Visit: Payer: Self-pay | Admitting: Dermatology

## 2013-03-30 ENCOUNTER — Ambulatory Visit (INDEPENDENT_AMBULATORY_CARE_PROVIDER_SITE_OTHER): Payer: 59 | Admitting: Physician Assistant

## 2013-03-30 VITALS — BP 110/72 | HR 92 | Temp 98.2°F | Resp 18 | Ht 65.5 in | Wt 132.2 lb

## 2013-03-30 DIAGNOSIS — R300 Dysuria: Secondary | ICD-10-CM

## 2013-03-30 DIAGNOSIS — N3010 Interstitial cystitis (chronic) without hematuria: Secondary | ICD-10-CM | POA: Insufficient documentation

## 2013-03-30 LAB — POCT URINALYSIS DIPSTICK
Glucose, UA: NEGATIVE
Spec Grav, UA: 1.01
Urobilinogen, UA: 0.2

## 2013-03-30 LAB — POCT UA - MICROSCOPIC ONLY
Crystals, Ur, HPF, POC: NEGATIVE
Epithelial cells, urine per micros: NEGATIVE
Mucus, UA: NEGATIVE
RBC, urine, microscopic: NEGATIVE

## 2013-03-30 MED ORDER — PHENAZOPYRIDINE HCL 200 MG PO TABS: 200.0000 mg | ORAL_TABLET | Freq: Three times a day (TID) | ORAL | Status: AC | PRN

## 2013-03-30 MED ORDER — SULFAMETHOXAZOLE-TRIMETHOPRIM 800-160 MG PO TABS: 1.0000 | ORAL_TABLET | Freq: Two times a day (BID) | ORAL | Status: AC

## 2013-03-30 MED ORDER — FLUCONAZOLE 150 MG PO TABS: 150.0000 mg | ORAL_TABLET | Freq: Once | ORAL | Status: AC

## 2013-03-30 NOTE — Progress Notes (Signed)
  Subjective:    Patient ID: Maricela Bo, female    DOB: 07-Oct-1970, 42 y.o.   MRN: [id number]  HPI This 42 y.o. female presents for evaluation of dysuria.  Several days ago she noticed vaginal itching, which resolved with treatment for yeast.  Then she noted burning with urination.  Mild urgency and frequency.  No bleeding. No vaginal discharge.  History of interstitial cystitis. Monogamous sex with her husband.  Medications, allergies, past medical history, surgical history, family history, social history and problem list reviewed.   Review of Systems No fever, chills.  No GI symptoms.  No back/abdominal pain.    Objective:   Physical Exam  Constitutional: She is oriented to person, place, and time. Vital signs are normal. She appears well-developed and well-nourished. No distress.  HENT:  Head: Normocephalic and atraumatic.  Cardiovascular: Normal rate, regular rhythm and normal heart sounds.   Pulmonary/Chest: Effort normal and breath sounds normal.  Abdominal: Soft. Normal appearance and bowel sounds are normal. She exhibits no distension and no mass. There is no hepatosplenomegaly. There is no tenderness. There is no rigidity, no rebound, no guarding, no CVA tenderness, no tenderness at McBurney's point and negative Murphy's sign. No hernia.  Musculoskeletal: Normal range of motion.       Lumbar back: Normal.  Neurological: She is alert and oriented to person, place, and time.  Skin: Skin is warm and dry. No rash noted. She is not diaphoretic. No pallor.  Psychiatric: She has a normal mood and affect. Her speech is normal and behavior is normal. Judgment normal.   Results for orders placed in visit on 03/30/13  POCT URINALYSIS DIPSTICK      Result Value Range   Color, UA yellow     Clarity, UA hazy     Glucose, UA neg     Bilirubin, UA neg     Ketones, UA neg     Spec Grav, UA 1.010     Blood, UA moderate     pH, UA 7.0     Protein, UA neg     Urobilinogen, UA 0.2     Nitrite, UA neg     Leukocytes, UA large (3+)    POCT UA - MICROSCOPIC ONLY      Result Value Range   WBC, Ur, HPF, POC tntc     RBC, urine, microscopic neg     Bacteria, U Microscopic 1+     Mucus, UA neg     Epithelial cells, urine per micros neg     Crystals, Ur, HPF, POC neg     Casts, Ur, LPF, POC neg     Yeast, UA neg            Assessment & Plan:  Dysuria - Plan: POCT urinalysis dipstick, POCT UA - Microscopic Only, sulfamethoxazole-trimethoprim (BACTRIM DS,SEPTRA DS) 800-160 MG per tablet, fluconazole (DIFLUCAN) 150 MG tablet, Urine culture, phenazopyridine (PYRIDIUM) 200 MG tablet  Interstitial cystitis   Await UCx.  May need follow-up with urology.  Fernande Bras, PA-C Physician Assistant-Certified [facility]

## 2013-03-30 NOTE — Patient Instructions (Signed)
Get plenty of rest and drink at least 64 ounces of water daily. 

## 2013-04-01 LAB — URINE CULTURE

## 2016-10-31 ENCOUNTER — Emergency Department (HOSPITAL_COMMUNITY): Admission: EM | Admit: 2016-10-31 | Discharge: 2016-10-31 | Discharge status: Against Medical Advice | Payer: Self-pay

## 2016-10-31 NOTE — ED Notes (Signed)
Patient was placed in lobby via EMS. Registration informed this RN that pt was leaving.

## 2016-10-31 NOTE — ED Triage Notes (Signed)
Per [facility] pt fell on her last 2 steps face first. Pt c/o right knee pain 2/10. Pt states pain worse when bending.
